# Patient Record
Sex: Male | Born: 1995 | Hispanic: No | Marital: Single | Smoking: Light tobacco smoker
Health system: Southern US, Community
[De-identification: ages and names within clinical notes are randomized; demographics above are authoritative.]

---
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
## 2014-05-22 ENCOUNTER — Telehealth: Payer: Self-pay | Admitting: Family Medicine

## 2014-05-22 ENCOUNTER — Encounter: Payer: Self-pay | Admitting: Family Medicine

## 2014-05-22 ENCOUNTER — Ambulatory Visit (INDEPENDENT_AMBULATORY_CARE_PROVIDER_SITE_OTHER): Payer: Medicaid Other | Admitting: Family Medicine

## 2014-05-22 ENCOUNTER — Other Ambulatory Visit (HOSPITAL_COMMUNITY)
Admission: RE | Admit: 2014-05-22 | Discharge: 2014-05-22 | Disposition: A | Discharge status: Home / Self Care | Payer: Medicaid Other | Source: Ambulatory Visit | Attending: Family Medicine | Admitting: Family Medicine

## 2014-05-22 VITALS — BP 123/75 | HR 99 | Temp 98.2°F | Wt 169.0 lb

## 2014-05-22 DIAGNOSIS — R059 Cough, unspecified: Secondary | ICD-10-CM

## 2014-05-22 DIAGNOSIS — R1012 Left upper quadrant pain: Secondary | ICD-10-CM

## 2014-05-22 DIAGNOSIS — Z113 Encounter for screening for infections with a predominantly sexual mode of transmission: Secondary | ICD-10-CM | POA: Diagnosis present

## 2014-05-22 DIAGNOSIS — A749 Chlamydial infection, unspecified: Secondary | ICD-10-CM

## 2014-05-22 DIAGNOSIS — B2790 Infectious mononucleosis, unspecified without complication: Secondary | ICD-10-CM

## 2014-05-22 DIAGNOSIS — R05 Cough: Secondary | ICD-10-CM

## 2014-05-22 DIAGNOSIS — Z202 Contact with and (suspected) exposure to infections with a predominantly sexual mode of transmission: Secondary | ICD-10-CM

## 2014-05-22 DIAGNOSIS — R5381 Other malaise: Secondary | ICD-10-CM

## 2014-05-22 LAB — CBC WITH DIFFERENTIAL/PLATELET
BASOS PCT: 0 % (ref 0–1)
Basophils Absolute: 0 10*3/uL (ref 0.0–0.1)
EOS PCT: 0 % (ref 0–5)
Eosinophils Absolute: 0 10*3/uL (ref 0.0–0.7)
HEMATOCRIT: 49.3 % (ref 39.0–52.0)
Hemoglobin: 16.9 g/dL (ref 13.0–17.0)
LYMPHS PCT: 29 % (ref 12–46)
Lymphs Abs: 1.9 10*3/uL (ref 0.7–4.0)
MCH: 31.1 pg (ref 26.0–34.0)
MCHC: 34.3 g/dL (ref 30.0–36.0)
MCV: 90.8 fL (ref 78.0–100.0)
MONO ABS: 0.4 10*3/uL (ref 0.1–1.0)
MONOS PCT: 6 % (ref 3–12)
MPV: 9.7 fL (ref 8.6–12.4)
NEUTROS ABS: 4.4 10*3/uL (ref 1.7–7.7)
Neutrophils Relative %: 65 % (ref 43–77)
Platelets: 188 10*3/uL (ref 150–400)
RBC: 5.43 MIL/uL (ref 4.22–5.81)
RDW: 14.4 % (ref 11.5–15.5)
WBC: 6.7 10*3/uL (ref 4.0–10.5)

## 2014-05-22 LAB — COMPREHENSIVE METABOLIC PANEL
ALK PHOS: 48 U/L (ref 39–117)
ALT: 12 U/L (ref 0–53)
AST: 17 U/L (ref 0–37)
Albumin: 4.2 g/dL (ref 3.5–5.2)
BILIRUBIN TOTAL: 0.6 mg/dL (ref 0.2–1.1)
BUN: 13 mg/dL (ref 6–23)
CO2: 24 mEq/L (ref 19–32)
CREATININE: 1.2 mg/dL (ref 0.50–1.35)
Calcium: 9.2 mg/dL (ref 8.4–10.5)
Chloride: 108 mEq/L (ref 96–112)
Glucose, Bld: 86 mg/dL (ref 70–99)
Potassium: 4.4 mEq/L (ref 3.5–5.3)
Sodium: 139 mEq/L (ref 135–145)
Total Protein: 6.7 g/dL (ref 6.0–8.3)

## 2014-05-22 LAB — POCT MONO (EPSTEIN BARR VIRUS): MONO, POC: POSITIVE — AB

## 2014-05-22 NOTE — Patient Instructions (Signed)
Nice to meet you. We are going to get some lab work and a chest x-ray to determine what is going on.  You could potentially have a viral illness or a pneumonia.  Please go get the chest x-ray as soon as possible.

## 2014-05-22 NOTE — Progress Notes (Signed)
Patient ID: Jonathan Carpenter, male   DOB: 09/10/1995, 19 y.o.   MRN: [id number]  Marikay AlarEric Jarron Curley, MD Phone: [phone]  Jonathan Carpenter is a 19 y.o. male who presents today for same day visit.  Rib pain: notes pain in his left lower ribs starting 2 weeks ago, though when asked to point to the area of the pain he points to the LUQ of his abdomen. Associated with cough, chills, and rhinorrhea. No nasal congestion. No calf pain or swelling. No chest pain. No ear pain. Notes has felt feverish, though has not checked his temperature. Notes sharp pains when he breaths in. Is worried about PNA as several family members have had PNA. States difficult to take a deep breath due to pain. He notes it does not hurt when pressing on the ribs, but hurts more when he presses in the LUQ of his abdomen. It is a sharp pain. The pain is not present if he is not pressing. He notes he has sickle cell trait. States he smokes. He smells of marijuana. Denies injury to his abdomen.  STD check: states no symptoms. Has been sexually active with 3 partners in the past 6 months. One of them may have gotten an STD recently. He uses condoms most of the time. Just wants to get checked.   Patient is a smoker.   ROS: Per HPI   Physical Exam Filed Vitals:   05/22/14 0900  BP: 123/75  Pulse: 99  Temp: 98.2 F (36.8 C)    Gen: Well NAD HEENT: PERRL,  MMM, no OP erythema or tonsillar swelling, no exudate in OP Lungs: CTABL Nl WOB Heart: RRR  Abd: soft, TTP in left upper quadrant with deep palpation, ND, no guarding or rebound, no hepatosplenomegaly Exts: Non edematous BL  LE, warm and well perfused.    Assessment/Plan: Please see individual problem list.  Marikay AlarEric Tonette Koehne, MD [facility] PGY[facility]

## 2014-05-22 NOTE — Telephone Encounter (Signed)
Called patient to discuss monospot results. Advised of positive mono testing and that this is likely the cause of his symptoms. I discussed that this is likely obtained from saliva her shared with another person. I advised him on the risk of splenic rupture with contact to his body and advised that he should not participate in any physical activity with the exception of walking, advised against activities that include contact to his body, for the next 3 weeks. The patients phone cut out and I attempted to call him back 2 times and it went straight to voicemail. The patient shortly called back to the office. I advised to avoid contact of any kind that would share his saliva. I advised of the risk of splenic rupture with contact and that this risk was greatest in the first 3 weeks of the illness. He voiced understanding of all of this. I will have the blue team nurses call the patient to set up an appointment for 3 weeks from today for follow-up.

## 2014-05-23 ENCOUNTER — Telehealth: Payer: Self-pay | Admitting: Family Medicine

## 2014-05-23 DIAGNOSIS — A749 Chlamydial infection, unspecified: Secondary | ICD-10-CM | POA: Insufficient documentation

## 2014-05-23 DIAGNOSIS — B2790 Infectious mononucleosis, unspecified without complication: Secondary | ICD-10-CM | POA: Insufficient documentation

## 2014-05-23 LAB — RPR

## 2014-05-23 LAB — URINE CYTOLOGY ANCILLARY ONLY
CHLAMYDIA, DNA PROBE: POSITIVE — AB
NEISSERIA GONORRHEA: NEGATIVE

## 2014-05-23 LAB — HIV ANTIBODY (ROUTINE TESTING W REFLEX): HIV 1&2 Ab, 4th Generation: NONREACTIVE

## 2014-05-23 MED ORDER — AZITHROMYCIN 250 MG PO TABS: 1000.0000 mg | ORAL_TABLET | Freq: Once | ORAL | Status: DC

## 2014-05-23 NOTE — Telephone Encounter (Signed)
appt made for 06/11/13 with Dr. Birdie SonsSonnenberg. Jazmin Hartsell,CMA

## 2014-05-23 NOTE — Assessment & Plan Note (Signed)
Patient is chlamydia positive on testing for STI's. HIV, RPR, and GC negative. Will treat with azithromycin 1 g once. See phone note from 1/8 for counseling on positive chlamydia result.

## 2014-05-23 NOTE — Telephone Encounter (Signed)
Called patient to advise of positive chlamydia test. Will send in azithromycin 1 g to treat this infection. He had negative GC testing so no need for ceftriaxone. Advised to not have sex for the week following receiving treatment. Advised that he should inform his partners so they can be tested.

## 2014-05-23 NOTE — Assessment & Plan Note (Addendum)
Patient with wide range of symptoms including cough, rhinorrhea, chills, and LUQ pain. All of these could be explained by the positive monospot. Patient with mono leading to his URI like symptoms, fatigue, and LUQ discomfort with deep palpation. His lungs are clear making PNA an unlikely cause, though with his constellation of symptoms CXR was ordered prior to the monospot returning due to concern for possible walking PNA. CBC and CMET both normal making a splenic issue related to sickle cell trait unlikely. Afebrile and VSS. Please see phone note from 05/22/14 for counseling on avoidance of contact activities for the next 3 weeks until he follows-up in the office, splenic rupture, and avoidance of shared saliva. Patient will return in 3 weeks. Advised of return precautions.

## 2014-06-11 ENCOUNTER — Ambulatory Visit: Payer: Medicaid Other | Admitting: Family Medicine

## 2014-12-19 ENCOUNTER — Encounter (HOSPITAL_COMMUNITY): Payer: Self-pay | Admitting: *Deleted

## 2014-12-19 ENCOUNTER — Emergency Department (HOSPITAL_COMMUNITY)
Admission: EM | Admit: 2014-12-19 | Discharge: 2014-12-19 | Disposition: A | Discharge status: Home / Self Care | Payer: Medicaid Other | Attending: Emergency Medicine | Admitting: Emergency Medicine

## 2014-12-19 ENCOUNTER — Emergency Department (HOSPITAL_COMMUNITY): Payer: Medicaid Other

## 2014-12-19 DIAGNOSIS — Z720 Tobacco use: Secondary | ICD-10-CM | POA: Diagnosis not present

## 2014-12-19 DIAGNOSIS — S0990XA Unspecified injury of head, initial encounter: Secondary | ICD-10-CM | POA: Diagnosis not present

## 2014-12-19 DIAGNOSIS — S199XXA Unspecified injury of neck, initial encounter: Secondary | ICD-10-CM | POA: Diagnosis not present

## 2014-12-19 DIAGNOSIS — S3991XA Unspecified injury of abdomen, initial encounter: Secondary | ICD-10-CM | POA: Diagnosis not present

## 2014-12-19 DIAGNOSIS — Y998 Other external cause status: Secondary | ICD-10-CM | POA: Diagnosis not present

## 2014-12-19 DIAGNOSIS — Y9389 Activity, other specified: Secondary | ICD-10-CM | POA: Insufficient documentation

## 2014-12-19 DIAGNOSIS — S27329A Contusion of lung, unspecified, initial encounter: Secondary | ICD-10-CM | POA: Diagnosis not present

## 2014-12-19 DIAGNOSIS — Y92410 Unspecified street and highway as the place of occurrence of the external cause: Secondary | ICD-10-CM | POA: Diagnosis not present

## 2014-12-19 DIAGNOSIS — S299XXA Unspecified injury of thorax, initial encounter: Secondary | ICD-10-CM | POA: Diagnosis present

## 2014-12-19 MED ORDER — IBUPROFEN 800 MG PO TABS: 800.0000 mg | ORAL_TABLET | Freq: Three times a day (TID) | ORAL | Status: DC | PRN

## 2014-12-19 MED ORDER — KETOROLAC TROMETHAMINE 30 MG/ML IJ SOLN
30.0000 mg | Freq: Once | INTRAMUSCULAR | Status: AC
  Administered 2014-12-19: 30 mg via INTRAVENOUS
  Filled 2014-12-19: qty 1

## 2014-12-19 MED ORDER — FENTANYL CITRATE (PF) 100 MCG/2ML IJ SOLN
100.0000 ug | Freq: Once | INTRAMUSCULAR | Status: AC
  Administered 2014-12-19: 100 ug via INTRAVENOUS
  Filled 2014-12-19: qty 2

## 2014-12-19 MED ORDER — IOHEXOL 300 MG/ML  SOLN
100.0000 mL | Freq: Once | INTRAMUSCULAR | Status: AC | PRN
  Administered 2014-12-19: 100 mL via INTRAVENOUS

## 2014-12-19 MED ORDER — HYDROCODONE-ACETAMINOPHEN 5-325 MG PO TABS: 1.0000 | ORAL_TABLET | Freq: Four times a day (QID) | ORAL | Status: DC | PRN

## 2014-12-19 NOTE — ED Notes (Signed)
Questions r/t dc were denied. Pt a&ox4. Pt is ambulatory but will use wheelchair for transport out

## 2014-12-19 NOTE — Discharge Instructions (Signed)
Return here as needed.  Follow-up with an urgent care or primary care doctor.  Use ice and heat on the areas that are sore

## 2014-12-19 NOTE — ED Notes (Signed)
Bed: WA18 Expected date:  Expected time:  Means of arrival:  Comments: hold 
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
## 2014-12-19 NOTE — ED Notes (Signed)
While changing patient into gown, he did admit that he drinks alcohol "occasionally"

## 2015-05-29 ENCOUNTER — Ambulatory Visit: Payer: Medicaid Other | Admitting: Family Medicine

## 2015-06-05 ENCOUNTER — Encounter: Payer: Self-pay | Admitting: Family Medicine

## 2015-06-05 ENCOUNTER — Other Ambulatory Visit (HOSPITAL_COMMUNITY)
Admission: RE | Admit: 2015-06-05 | Discharge: 2015-06-05 | Disposition: A | Discharge status: Home / Self Care | Payer: Medicaid Other | Source: Ambulatory Visit | Attending: Family Medicine | Admitting: Family Medicine

## 2015-06-05 ENCOUNTER — Ambulatory Visit (INDEPENDENT_AMBULATORY_CARE_PROVIDER_SITE_OTHER): Payer: Self-pay | Admitting: Family Medicine

## 2015-06-05 VITALS — BP 127/76 | HR 96 | Temp 98.1°F | Wt 172.0 lb

## 2015-06-05 DIAGNOSIS — Z113 Encounter for screening for infections with a predominantly sexual mode of transmission: Secondary | ICD-10-CM

## 2015-06-05 NOTE — Progress Notes (Signed)
    Subjective:  Jonathan Carpenter is a 20 y.o. male who presents to the [facility] today with a chief complaint of STD check.   HPI:  STD Screening Patient presents requesting STD screening. States that was sexually active with a partner last month that called him yesterday to tell him that she was diagnosed with chlamydia. Patient denies penile discharge or dysuria. No fevers or chills. Is currently sexually active with another partner.   ROS: Per HPI  Objective:  Physical Exam: BP 127/76 mmHg  Pulse 96  Temp(Src) 98.1 F (36.7 C) (Oral)  Wt 172 lb (78.019 kg)  Gen: NAD, resting comfortably CV: RRR with no murmurs appreciated Pulm: NWOB, CTAB with no crackles, wheezes, or rhonchi MSK: no edema, cyanosis, or clubbing noted Skin: warm, dry Neuro: grossly normal, moves all extremities Psych: Normal affect and thought content  Assessment/Plan:  STD Exposure No signs or symptoms. Will check GC/CT, HIV, and RPR today. Counseled on safe sex practices.   Katina Degree. Jimmey Ralph, MD [facility] Family Medicine Resident PGY-2 06/05/2015 3:05 PM

## 2015-06-05 NOTE — Patient Instructions (Signed)
Safe Sex  Safe sex is about reducing the risk of giving or getting a sexually transmitted disease (STD). STDs are spread through sexual contact involving the genitals, mouth, or rectum. Some STDs can be cured and others cannot. Safe sex can also prevent unintended pregnancies.   WHAT ARE SOME SAFE SEX PRACTICES?  · Limit your sexual activity to only one partner who is having sex with only you.  · Talk to your partner about his or her past partners, past STDs, and drug use.  · Use a condom every time you have sexual intercourse. This includes vaginal, oral, and anal sexual activity. Both females and males should wear condoms during oral sex. Only use latex or polyurethane condoms and water-based lubricants. Using petroleum-based lubricants or oils to lubricate a condom will weaken the condom and increase the chance that it will break. The condom should be in place from the beginning to the end of sexual activity. Wearing a condom reduces, but does not completely eliminate, your risk of getting or giving an STD. STDs can be spread by contact with infected body fluids and skin.  · Get vaccinated for hepatitis B and HPV.  · Avoid alcohol and recreational drugs, which can affect your judgment. You may forget to use a condom or participate in high-risk sex.  · For females, avoid douching after sexual intercourse. Douching can spread an infection farther into the reproductive tract.  · Check your body for signs of sores, blisters, rashes, or unusual discharge. See your health care provider if you notice any of these signs.  · Avoid sexual contact if you have symptoms of an infection or are being treated for an STD. If you or your partner has herpes, avoid sexual contact when blisters are present. Use condoms at all other times.  · If you are at risk of being infected with HIV, it is recommended that you take a prescription medicine daily to prevent HIV infection. This is called pre-exposure prophylaxis (PrEP). You are  considered at risk if:    You are a man who has sex with other men (MSM).    You are a heterosexual man or woman who is sexually active with more than one partner.    You take drugs by injection.    You are sexually active with a partner who has HIV.  · Talk with your health care provider about whether you are at high risk of being infected with HIV. If you choose to begin PrEP, you should first be tested for HIV. You should then be tested every 3 months for as long as you are taking PrEP.  · See your health care provider for regular screenings, exams, and tests for other STDs. Before having sex with a new partner, each of you should be screened for STDs and should talk about the results with each other.  WHAT ARE THE BENEFITS OF SAFE SEX?   · There is less chance of getting or giving an STD.  · You can prevent unwanted or unintended pregnancies.  · By discussing safe sex concerns with your partner, you may increase feelings of intimacy, comfort, trust, and honesty between the two of you.     This information is not intended to replace advice given to you by your health care provider. Make sure you discuss any questions you have with your health care provider.     Document Released: 06/09/2004 Document Revised: 05/23/2014 Document Reviewed: 10/24/2011  Elsevier Interactive Patient Education ©2016 [employer]

## 2015-06-06 LAB — HIV ANTIBODY (ROUTINE TESTING W REFLEX): HIV: NONREACTIVE

## 2015-06-06 LAB — RPR

## 2015-06-08 ENCOUNTER — Telehealth: Payer: Self-pay | Admitting: Family Medicine

## 2015-06-08 ENCOUNTER — Encounter: Payer: Self-pay | Admitting: Family Medicine

## 2015-06-08 LAB — URINE CYTOLOGY ANCILLARY ONLY
Chlamydia: POSITIVE — AB
Neisseria Gonorrhea: NEGATIVE

## 2015-06-08 MED ORDER — AZITHROMYCIN 500 MG PO TABS: 1000.0000 mg | ORAL_TABLET | Freq: Every day | ORAL | Status: DC

## 2015-06-08 NOTE — Telephone Encounter (Signed)
Urine positive for chlamydia. Will treat patient in office and send in prescription for his partner. They should refrain from intercourse for at least week after treatment.  Jonathan Carpenter. Jimmey Ralph, MD [facility] Family Medicine Resident PGY-2 06/08/2015 5:02 PM

## 2015-06-09 NOTE — Telephone Encounter (Signed)
Tried cllng pt @ [phone] - recvd recording..the wireless customer you're trying to reach is not available, please try your call again later. Will try cllng pt later today.

## 2015-06-09 NOTE — Telephone Encounter (Signed)
Tried cllng pt again. Recvd recording once again stating customer was not available. Will try cllng pt on Wednesday, January 25th.

## 2015-06-09 NOTE — Telephone Encounter (Signed)
Tried cllng again - recvd recording. Will trying cllng again before end of business day.

## 2015-06-10 NOTE — Telephone Encounter (Signed)
Numerous calls made to pt - unsuccessful in reaching pt. Mailing letter to pt and faxing lab results to [facility].

## 2015-06-10 NOTE — Telephone Encounter (Signed)
VM not set up.  Gwenevere Goga,CMA

## 2015-08-11 ENCOUNTER — Other Ambulatory Visit (HOSPITAL_COMMUNITY)
Admission: RE | Admit: 2015-08-11 | Discharge: 2015-08-11 | Disposition: A | Discharge status: Home / Self Care | Payer: Medicaid Other | Source: Ambulatory Visit | Attending: Family Medicine | Admitting: Family Medicine

## 2015-08-11 ENCOUNTER — Encounter: Payer: Self-pay | Admitting: Student

## 2015-08-11 ENCOUNTER — Ambulatory Visit (INDEPENDENT_AMBULATORY_CARE_PROVIDER_SITE_OTHER): Payer: Medicaid Other | Admitting: Student

## 2015-08-11 VITALS — BP 98/54 | HR 64 | Temp 98.2°F | Wt 168.4 lb

## 2015-08-11 DIAGNOSIS — A749 Chlamydial infection, unspecified: Secondary | ICD-10-CM | POA: Diagnosis not present

## 2015-08-11 DIAGNOSIS — A64 Unspecified sexually transmitted disease: Secondary | ICD-10-CM

## 2015-08-11 DIAGNOSIS — Z113 Encounter for screening for infections with a predominantly sexual mode of transmission: Secondary | ICD-10-CM | POA: Diagnosis present

## 2015-08-11 MED ORDER — CEFTRIAXONE SODIUM 1 G IJ SOLR
250.0000 mg | Freq: Once | INTRAMUSCULAR | Status: AC
  Administered 2015-08-11: 250 mg via INTRAMUSCULAR

## 2015-08-11 MED ORDER — AZITHROMYCIN 250 MG PO TABS
1000.0000 mg | ORAL_TABLET | Freq: Once | ORAL | Status: AC
  Administered 2015-08-11: 1000 mg via ORAL

## 2015-08-11 NOTE — Patient Instructions (Signed)
Follow-up as needed Your treated for gonorrhea and chlamydia in the office. Please abstain from intercourse until he 7 days after treatment. Please inform all recent sexual partners that they should be tested for STDs and treated as needed.  If questions or concerns call the office at [phone]

## 2015-08-11 NOTE — Progress Notes (Signed)
   Subjective:    Patient ID: Jonathan Carpenter, male    DOB: 11/04/1995, 20 y.o.   MRN: [id number]   CC: STD check  HPI: 20 y/o presents for STD check.   STD concern -  Patient diagnosed with chlamydia on 1/20 and never received treatment. - Patient denies pedal discharge, lesions, rash, abdominal pain, fevers   Review of Systems ROS  per history of present illness otherwise patient denies recent illnesses, chest pain, shortness of breath, nausea, vomiting, diarrhea, headache Past Medical, Surgical, Social, and Family History Reviewed & Updated per EMR.   Objective:  BP 98/54 mmHg  Pulse 64  Temp(Src) 98.2 F (36.8 C) (Oral)  Wt 168 lb 6.4 oz (76.386 kg) Vitals and nursing note reviewed  General: NAD Cardiac: RRR, Respiratory: CTAB, normal effort Abdomen: soft, nontender, nondistended Extremities: no edema or cyanosis. WWP. Skin: warm and dry, no rashes noted Neuro: alert and oriented, no focal deficits GU: No penile lesions or rashes, no penile discharge, no inguinal lymphadenopathy   Assessment & Plan:    Chlamydia infection Chlamydia infection diagnosed 06/05/2015 and never received treatment. Will recheck today. Azithromycin 1 g and Rocephin 250 mg given the office. Patient given precautions to abstain from sexual intercourse until he and his partners have been treated.     Latese Dufault A. Kennon RoundsHaney MD, MS Family Medicine Resident PGY-2 Pager [phone]

## 2015-08-11 NOTE — Assessment & Plan Note (Signed)
Chlamydia infection diagnosed 06/05/2015 and never received treatment. Will recheck today. Azithromycin 1 g and Rocephin 250 mg given the office. Patient given precautions to abstain from sexual intercourse until he and his partners have been treated.

## 2015-08-13 LAB — URINE CYTOLOGY ANCILLARY ONLY
Chlamydia: POSITIVE — AB
Neisseria Gonorrhea: NEGATIVE
Trichomonas: NEGATIVE
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
## 2017-01-25 ENCOUNTER — Ambulatory Visit: Payer: Medicaid Other | Admitting: Family Medicine

## 2017-01-26 ENCOUNTER — Encounter: Payer: Self-pay | Admitting: Internal Medicine

## 2017-01-26 ENCOUNTER — Other Ambulatory Visit (HOSPITAL_COMMUNITY)
Admission: RE | Admit: 2017-01-26 | Discharge: 2017-01-26 | Disposition: A | Discharge status: Home / Self Care | Payer: Medicaid Other | Source: Ambulatory Visit | Attending: Family Medicine | Admitting: Family Medicine

## 2017-01-26 ENCOUNTER — Ambulatory Visit (INDEPENDENT_AMBULATORY_CARE_PROVIDER_SITE_OTHER): Payer: Self-pay | Admitting: Internal Medicine

## 2017-01-26 VITALS — BP 102/60 | HR 80 | Ht 74.0 in | Wt 164.6 lb

## 2017-01-26 DIAGNOSIS — B999 Unspecified infectious disease: Secondary | ICD-10-CM

## 2017-01-26 DIAGNOSIS — Z202 Contact with and (suspected) exposure to infections with a predominantly sexual mode of transmission: Secondary | ICD-10-CM | POA: Insufficient documentation

## 2017-01-26 DIAGNOSIS — A5489 Other gonococcal infections: Secondary | ICD-10-CM | POA: Insufficient documentation

## 2017-01-26 MED ORDER — CEFTRIAXONE SODIUM 250 MG IJ SOLR
250.0000 mg | Freq: Once | INTRAMUSCULAR | Status: AC
  Administered 2017-01-26: 250 mg via INTRAMUSCULAR

## 2017-01-26 MED ORDER — AZITHROMYCIN 250 MG PO TABS
1000.0000 mg | ORAL_TABLET | Freq: Once | ORAL | Status: AC
  Administered 2017-01-26: 1000 mg via ORAL

## 2017-01-26 NOTE — Progress Notes (Deleted)
HPI / Presenting Problem:  Most pressing concern regarding your child?  ***  Age of Onset / Duration of Symptoms:    ***  Degree of Functional Impairment:  Home, school, relationships.  ***   Home Interventions:  Things to consider:  verbal reprimands, time out, physical punishment, rewarding positive behavior, removal of privileges, giving in, ignoring the child and / or the behavior  ***  School Report and Interventions:  What has the teacher told you about your child?  ***  How does the school/teacher deal with the problem?  ***  Assessment of Possible Coexisting Conditions / Family History of Psychiatric Issues (ADHD, depression, anxiety, ODD / CD, substance abuse, learning disabilities, physical and sexual abuse, recent family stress):  ***  Behavioral Observations During the Interview (restless/fidgety, calm, loud, quiet, hostile, friendly, withdrawn, interactive).  ***  Name of school: [employer] Primary teacher: [employer] Dien[employer] Current grade: 1st grade and after school daycare Special education classes: *** Special education services (e.g. testing): *** Has the child ever been retained: ***  If so, grade and reason: *** Has the child ever been suspended: ***  If so, number of times and reason: *** Frequent absences from school (days of school missed this month):  ***   Possible Action Items:   - Caregiver will sign an Authorization to Release Information for communication with school.  - A Request for an ADHD Packet will be sent to the Intervention Support Team.  - Child will be referred for a more comprehensive evaluation [facility]:  [phone].  - Schedule CPE.  - Vision and hearing screen to be done as part of CPE.ROS- no palpitations or recent weight changes

## 2017-01-26 NOTE — Progress Notes (Signed)
[facility] Progress [facility]  Subjective:  Jonathan Carpenter is a 21 y.o. with history of multiple chlamydia infections, last treated 08/01/15, who presents for concern for STD exposure. He says his girlfriend informed him she was positive about 3 days ago. He does not recall if she was positive for gonorrhea or chlamydia but says it was one of those. He denies dysuria. He says he did note some white discharge from his penis twice in the last couple of weeks but none recently. He denies skin lesions of penis. He has only had one recent sexual partner and does not want HIV or RPR blood tests, as he said his girlfriend had recent negative testing. He denies any history of IV drug use. He participates in oral and vaginal sex but no oral sex. ROS: No fevers, no abdominal pain, no sore throat  Social: Current smoker. Not planning to quit presently.   No Known Allergies  Objective: Blood pressure 102/60, pulse 80, height 6\' 2"  (1.88 m), weight 164 lb 9.6 oz (74.7 kg). Body mass index is 21.13 kg/m. Constitutional: Thin, well-appearing young man in NAD HENT: Normal posterior oropharynx. Small < 1cm slightly tender submandibular lymph node on R.  Cardiovascular: RRR, S1, S2, no m/r/g.  Pulmonary/Chest: Effort normal and breath sounds normal. No respiratory distress.  Abdominal: Soft. +BS, NT, ND GU: Declined (says has not looked abnormal) Skin: No rash on exposed skin. Multiple tattoos.  Vitals reviewed  Assessment/Plan: STD exposure - Obtained urine cytology to test for gonorrhea and chlamydia. Patient declined HIV and RPR screening. - Will treat presumptively with 1 g azithromycin and 250 mg rocephin IM today. - Will call patient at [phone] (cell and can leave message) next week. He said to call [phone] (his girlfriend) if unable to get in touch with him.  - Counseled to abstain from sex for 1 week after treatment and to use condoms for protection.   Follow-up  prn.  Dani GobbleHillary Fitzgerald, MD [facility], PGY-3

## 2017-01-26 NOTE — Patient Instructions (Addendum)
Mr. Jonathan Carpenter,  You have been treated with medications that would cover for gonorrhea and chlamydia. I will call you with your results when they are available next week.  Please use protection, and I recommend waiting 7 days after treatment before having sex.  Best, Dr. Sampson GoonFitzgerald

## 2017-01-26 NOTE — Assessment & Plan Note (Signed)
-   Obtained urine cytology to test for gonorrhea and chlamydia. Patient declined HIV and RPR screening. - Will treat presumptively with 1 g azithromycin and 250 mg rocephin IM today. - Will call patient at [phone] (cell and can leave message) next week. He said to call [phone] (his girlfriend) if unable to get in touch with him.  - Counseled to abstain from sex for 1 week after treatment and to use condoms for protection.

## 2017-01-31 LAB — URINE CYTOLOGY ANCILLARY ONLY
CHLAMYDIA, DNA PROBE: NEGATIVE
Neisseria Gonorrhea: POSITIVE — AB

## 2017-04-15 ENCOUNTER — Emergency Department (HOSPITAL_COMMUNITY)
Admission: EM | Admit: 2017-04-15 | Discharge: 2017-04-15 | Discharge status: Against Medical Advice | Payer: Self-pay | Attending: Emergency Medicine | Admitting: Emergency Medicine

## 2017-04-15 ENCOUNTER — Emergency Department (HOSPITAL_COMMUNITY): Payer: Self-pay

## 2017-04-15 DIAGNOSIS — R569 Unspecified convulsions: Secondary | ICD-10-CM | POA: Insufficient documentation
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
## 2022-06-06 ENCOUNTER — Telehealth: Payer: Self-pay

## 2022-06-06 NOTE — Telephone Encounter (Signed)
Mychart msg sent. AS, CMA
